# Patient Record
Sex: Female | Born: 1995 | Race: White | Hispanic: No | Marital: Single | Smoking: Current some day smoker
Health system: Southern US, Community
[De-identification: ages and names within clinical notes are randomized; demographics above are authoritative.]

---
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
## 2017-02-04 IMAGING — CR DG CHEST 2V
2 series · 2 of 2 positions shown · non-contrast
Comparison: None

CLINICAL DATA: 19-year-old female with mid chest pain and
congestion

EXAM:
CHEST  2 VIEW

[w chest pa]
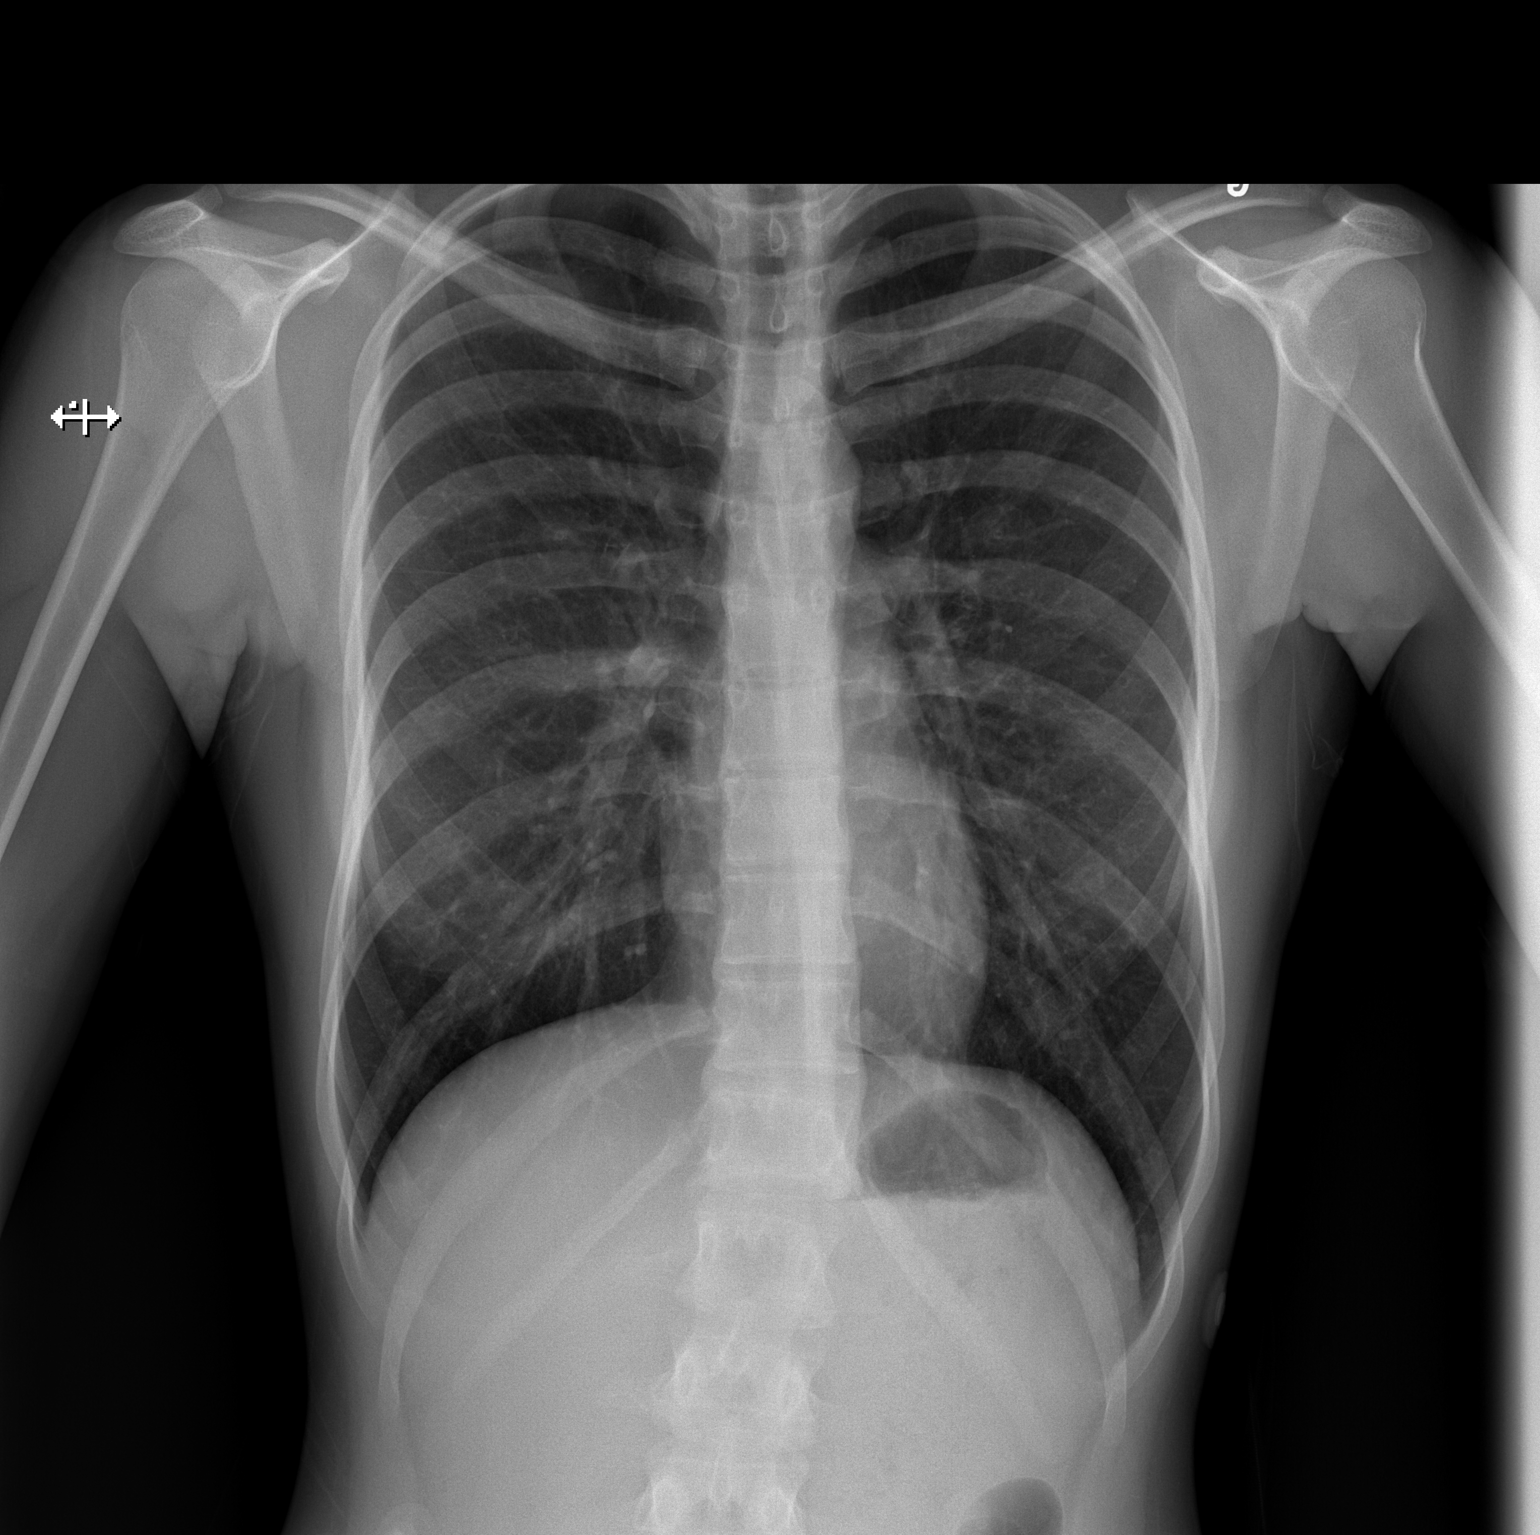

[w chest lat]
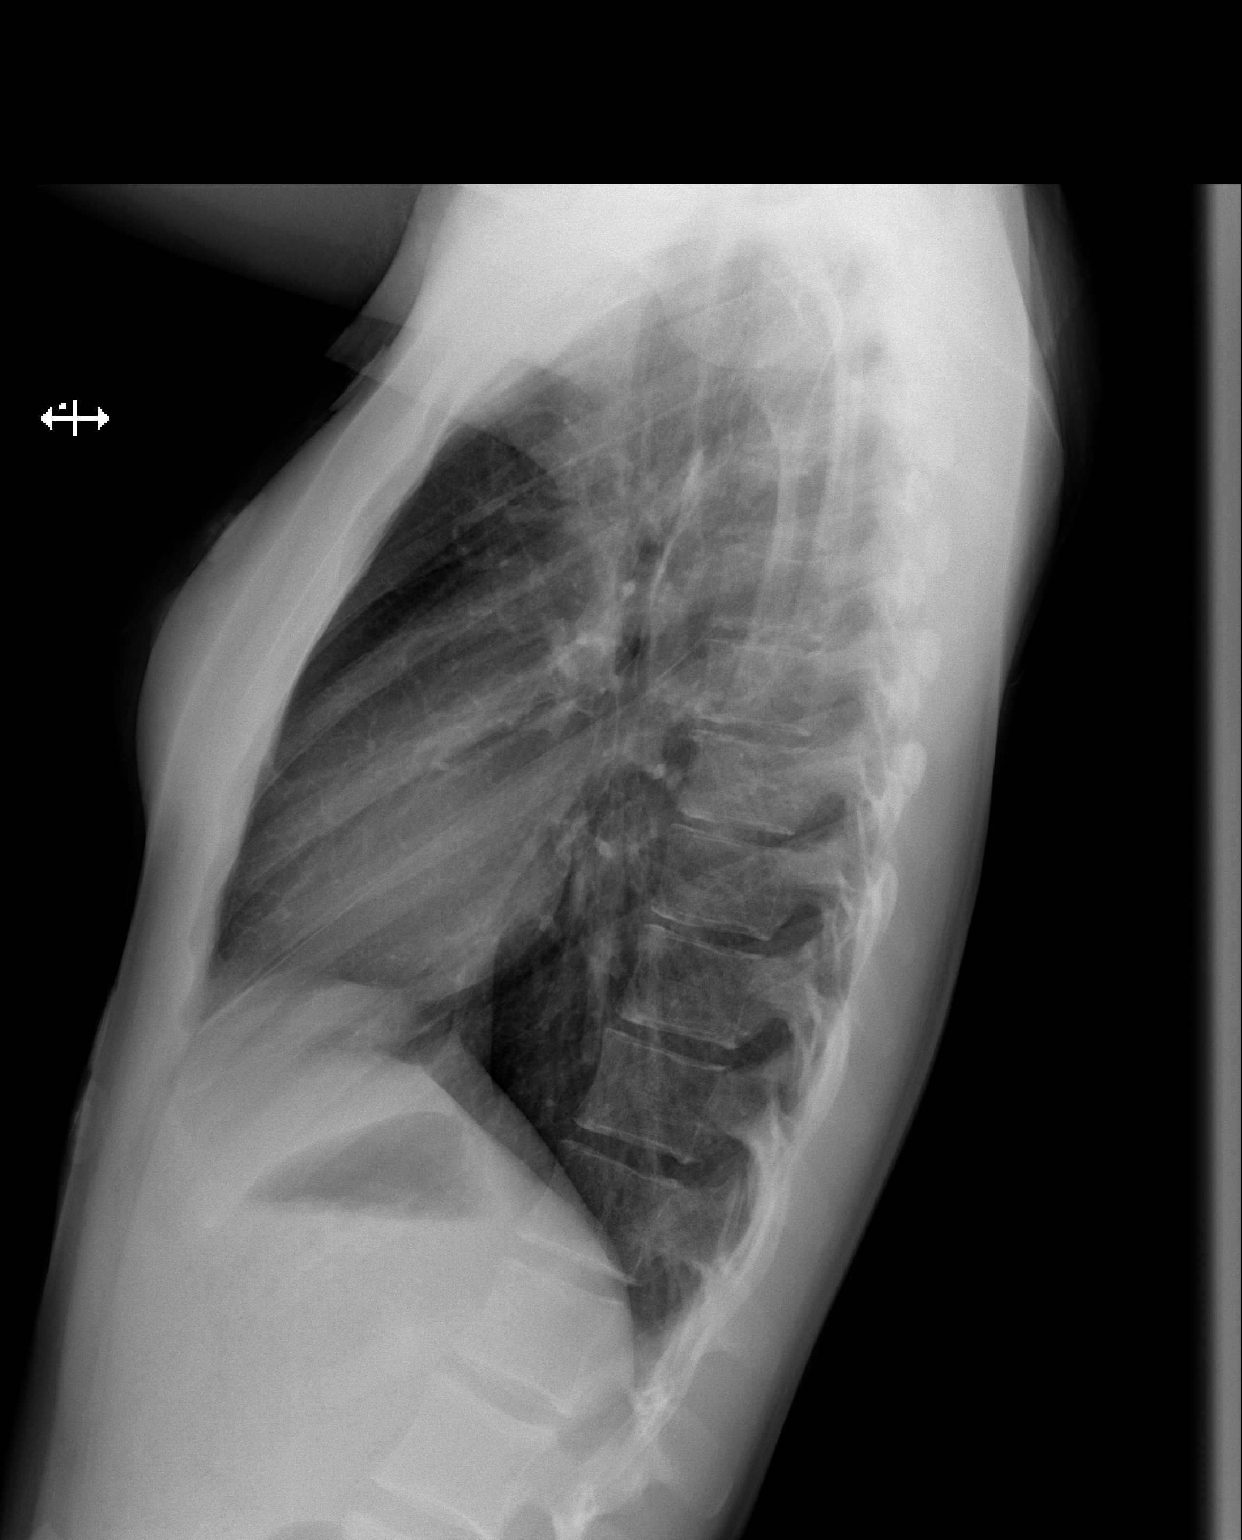

[2 of 2 positions shown; findings below may reference images not displayed]

FINDINGS: The lungs are clear and negative for focal airspace consolidation,
pulmonary edema or suspicious pulmonary nodule. No pleural effusion
or pneumothorax. Cardiac and mediastinal contours are within normal
limits. No acute fracture or lytic or blastic osseous lesions. The
visualized upper abdominal bowel gas pattern is unremarkable.
IMPRESSION: Normal chest x-ray.

## 2020-08-24 ENCOUNTER — Encounter (HOSPITAL_COMMUNITY): Payer: Self-pay | Admitting: Emergency Medicine

## 2020-08-24 ENCOUNTER — Emergency Department (HOSPITAL_COMMUNITY)
Admission: EM | Admit: 2020-08-24 | Discharge: 2020-08-24 | Disposition: A | Discharge status: Home / Self Care | Payer: Self-pay | Attending: Emergency Medicine | Admitting: Emergency Medicine

## 2020-08-24 ENCOUNTER — Emergency Department (HOSPITAL_COMMUNITY): Payer: Self-pay

## 2020-08-24 DIAGNOSIS — F17200 Nicotine dependence, unspecified, uncomplicated: Secondary | ICD-10-CM | POA: Insufficient documentation

## 2020-08-24 DIAGNOSIS — N83209 Unspecified ovarian cyst, unspecified side: Secondary | ICD-10-CM | POA: Insufficient documentation

## 2020-08-24 LAB — COMPREHENSIVE METABOLIC PANEL
ALT: 33 U/L (ref 0–44)
AST: 25 U/L (ref 15–41)
Albumin: 3.7 g/dL (ref 3.5–5.0)
Alkaline Phosphatase: 61 U/L (ref 38–126)
Anion gap: 6 (ref 5–15)
BUN: 9 mg/dL (ref 6–20)
CO2: 26 mmol/L (ref 22–32)
Calcium: 9.1 mg/dL (ref 8.9–10.3)
Chloride: 105 mmol/L (ref 98–111)
Creatinine, Ser: 0.69 mg/dL (ref 0.44–1.00)
GFR, Estimated: >60 mL/min (ref >60)
Glucose, Bld: 84 mg/dL (ref 70–99)
Potassium: 3.8 mmol/L (ref 3.5–5.1)
Sodium: 137 mmol/L (ref 135–145)
Total Bilirubin: 1.7 mg/dL — ABNORMAL HIGH (ref 0.3–1.2)
Total Protein: 7 g/dL (ref 6.5–8.1)

## 2020-08-24 LAB — CBC
HCT: 41.4 % (ref 36.0–46.0)
Hemoglobin: 13.2 g/dL (ref 12.0–15.0)
MCH: 28.6 pg (ref 26.0–34.0)
MCHC: 31.9 g/dL (ref 30.0–36.0)
MCV: 89.8 fL (ref 80.0–100.0)
Platelets: 339 10*3/uL (ref 150–400)
RBC: 4.61 MIL/uL (ref 3.87–5.11)
RDW: 13.6 % (ref 11.5–15.5)
WBC: 11.5 10*3/uL — ABNORMAL HIGH (ref 4.0–10.5)
nRBC: 0 % (ref 0.0–0.2)

## 2020-08-24 LAB — URINALYSIS, ROUTINE W REFLEX MICROSCOPIC
Bacteria, UA: NONE SEEN
Bilirubin Urine: NEGATIVE
Glucose, UA: NEGATIVE mg/dL
Hgb urine dipstick: NEGATIVE
Ketones, ur: NEGATIVE mg/dL
Nitrite: NEGATIVE
Protein, ur: NEGATIVE mg/dL
Specific Gravity, Urine: 1.015 (ref 1.005–1.030)
pH: 7 (ref 5.0–8.0)

## 2020-08-24 LAB — I-STAT BETA HCG BLOOD, ED (MC, WL, AP ONLY): I-stat hCG, quantitative: <5 m[IU]/mL (ref <5)

## 2020-08-24 LAB — LIPASE, BLOOD: Lipase: 31 U/L (ref 11–51)

## 2020-08-24 MED ORDER — IOHEXOL 300 MG/ML  SOLN
100.0000 mL | Freq: Once | INTRAMUSCULAR | Status: AC | PRN
  Administered 2020-08-24: 100 mL via INTRAVENOUS

## 2020-08-24 MED ORDER — MORPHINE SULFATE (PF) 4 MG/ML IV SOLN
4.0000 mg | Freq: Once | INTRAVENOUS | Status: AC
  Administered 2020-08-24: 4 mg via INTRAVENOUS
  Filled 2020-08-24: qty 1

## 2020-08-24 MED ORDER — NAPROXEN 500 MG PO TABS: 500.0000 mg | ORAL_TABLET | Freq: Two times a day (BID) | ORAL | 0 refills | Status: DC

## 2020-08-24 NOTE — ED Notes (Signed)
Pt ready for CT

## 2020-08-24 NOTE — ED Provider Notes (Signed)
[facility] EMERGENCY DEPARTMENT Provider Note   CSN: [id number] Arrival date & time: 08/24/20  1539     History No chief complaint on file.   Merryn Thaker is a 25 y.o. female.  25 year old female presents with acute abdominal pain worse in her lower quadrants since last night.  Has had some mild diarrhea but no emesis.  No fever or chills.  No vaginal bleeding or discharge peer denies any urinary symptoms.  Pain is characterizes sharp and worse with movement.  No treatment use prior to arrival.  No prior history of same        History reviewed. No pertinent past medical history.  There are no problems to display for this patient.   History reviewed. No pertinent surgical history.   OB History   No obstetric history on file.     No family history on file.  Social History   Tobacco Use  . Smoking status: Current Some Day Smoker  Substance Use Topics  . Alcohol use: No  . Drug use: No    Home Medications Prior to Admission medications   Not on File    Allergies    Elemental sulfur and Keflex [cephalexin]  Review of Systems   Review of Systems  All other systems reviewed and are negative.   Physical Exam Updated Vital Signs BP 133/78 (BP Location: Right Arm)   Pulse 96   Temp 98.3 F (36.8 C)   Resp 16   LMP 06/26/2020   SpO2 100%   Physical Exam Vitals and nursing note reviewed.  Constitutional:      General: She is not in acute distress.    Appearance: Normal appearance. She is well-developed. She is not toxic-appearing.  HENT:     Head: Normocephalic and atraumatic.  Eyes:     General: Lids are normal.     Conjunctiva/sclera: Conjunctivae normal.     Pupils: Pupils are equal, round, and reactive to light.  Neck:     Thyroid: No thyroid mass.     Trachea: No tracheal deviation.  Cardiovascular:     Rate and Rhythm: Normal rate and regular rhythm.     Heart sounds: Normal heart sounds. No murmur heard. No gallop.    Pulmonary:     Effort: Pulmonary effort is normal. No respiratory distress.     Breath sounds: Normal breath sounds. No stridor. No decreased breath sounds, wheezing, rhonchi or rales.  Abdominal:     General: Bowel sounds are normal. There is no distension.     Palpations: Abdomen is soft.     Tenderness: There is no abdominal tenderness. There is no rebound.    Musculoskeletal:        General: No tenderness. Normal range of motion.     Cervical back: Normal range of motion and neck supple.  Skin:    General: Skin is warm and dry.     Findings: No abrasion or rash.  Neurological:     Mental Status: She is alert and oriented to person, place, and time.     GCS: GCS eye subscore is 4. GCS verbal subscore is 5. GCS motor subscore is 6.     Cranial Nerves: No cranial nerve deficit.     Sensory: No sensory deficit.  Psychiatric:        Speech: Speech normal.        Behavior: Behavior normal.     ED Results / Procedures / Treatments   Labs (all labs ordered  are listed, but only abnormal results are displayed) Labs Reviewed  COMPREHENSIVE METABOLIC PANEL - Abnormal; Notable for the following components:      Result Value   Total Bilirubin 1.7 (*)    All other components within normal limits  CBC - Abnormal; Notable for the following components:   WBC 11.5 (*)    All other components within normal limits  URINALYSIS, ROUTINE W REFLEX MICROSCOPIC - Abnormal; Notable for the following components:   APPearance HAZY (*)    Leukocytes,Ua TRACE (*)    All other components within normal limits  LIPASE, BLOOD  I-STAT BETA HCG BLOOD, ED ([facility], [facility], [facility] ONLY)    EKG None  Radiology No results found.  Procedures Procedures   Medications Ordered in ED Medications  morphine 4 MG/ML injection 4 mg (has no administration in time range)    ED Course  I have reviewed the triage vital signs and the nursing notes.  Pertinent labs & imaging results that were available during my care  of the patient were reviewed by me and considered in my medical decision making (see chart for details).    MDM Rules/Calculators/A&P                         Pregnancy test negative.  CT abdomen pelvis consistent with ovarian cyst.  Patient's abdomen is nonsurgical and will discharge home Patient is urinalysis likely contaminated. Final Clinical Impression(s) / ED Diagnoses Final diagnoses:  None    Rx / DC Orders ED Discharge Orders    None       Lorre Nick, MD 08/24/20 2230

## 2020-08-24 NOTE — ED Triage Notes (Signed)
Pt reports lower abd pain since last night with nausea.  Denies vomiting and diarrhea.  LMP 06/26/20

## 2020-09-02 ENCOUNTER — Encounter (HOSPITAL_COMMUNITY): Payer: Self-pay

## 2020-09-02 ENCOUNTER — Other Ambulatory Visit: Payer: Self-pay

## 2020-09-02 ENCOUNTER — Emergency Department (HOSPITAL_COMMUNITY)
Admission: EM | Admit: 2020-09-02 | Discharge: 2020-09-02 | Disposition: A | Discharge status: Home / Self Care | Payer: Self-pay | Attending: Emergency Medicine | Admitting: Emergency Medicine

## 2020-09-02 DIAGNOSIS — R112 Nausea with vomiting, unspecified: Secondary | ICD-10-CM | POA: Insufficient documentation

## 2020-09-02 DIAGNOSIS — R110 Nausea: Secondary | ICD-10-CM

## 2020-09-02 DIAGNOSIS — Z20822 Contact with and (suspected) exposure to covid-19: Secondary | ICD-10-CM | POA: Insufficient documentation

## 2020-09-02 DIAGNOSIS — F17200 Nicotine dependence, unspecified, uncomplicated: Secondary | ICD-10-CM | POA: Insufficient documentation

## 2020-09-02 DIAGNOSIS — R1030 Lower abdominal pain, unspecified: Secondary | ICD-10-CM | POA: Insufficient documentation

## 2020-09-02 LAB — POC SARS CORONAVIRUS 2 AG -  ED: SARS Coronavirus 2 Ag: NEGATIVE

## 2020-09-02 NOTE — ED Provider Notes (Signed)
[facility] EMERGENCY DEPARTMENT Provider Note   CSN: [id number] Arrival date & time: 09/02/20  1242     History No chief complaint on file.   Lisa Bright is a 25 y.o. female.  HPI   This patient is a 25 year old female, she has no significant chronic prior medical history, she was seen in the emergency department on 20 March, at that time she had a cyst on her ovary causing some lower abdominal pain.  This has been intermittent since that time, she did have an episode of vomiting a couple of days ago and did not go to work last night because of that, she was told that she needed a Covid test before she could come back.  She does not have any other Covid symptoms including respiratory symptoms or gastrointestinal symptoms.  She has not been nauseated since that time no urinary symptoms, pregnancy test from 10 days ago was negative  History reviewed. No pertinent past medical history.  There are no problems to display for this patient.   History reviewed. No pertinent surgical history.   OB History   No obstetric history on file.     No family history on file.  Social History   Tobacco Use  . Smoking status: Current Some Day Smoker  Substance Use Topics  . Alcohol use: No  . Drug use: No    Home Medications Prior to Admission medications   Medication Sig Start Date End Date Taking? Authorizing Provider  acetaminophen (TYLENOL) 500 MG tablet Take 1,000 mg by mouth every 6 (six) hours as needed for moderate pain or headache.    [provider]  naproxen (NAPROSYN) 500 MG tablet Take 1 tablet (500 mg total) by mouth 2 (two) times daily. 08/24/20   Lorre Nick, MD    Allergies    Elemental sulfur, Keflex [cephalexin], and Sulfamethoxazole  Review of Systems   Review of Systems  Constitutional: Negative for fever.  Respiratory: Negative for shortness of breath.   Gastrointestinal: Negative for nausea and vomiting.  Genitourinary:  Negative for dysuria.  Musculoskeletal: Negative for back pain and myalgias.  Neurological: Negative for weakness and numbness.    Physical Exam Updated Vital Signs BP 123/86 (BP Location: Left Arm)   Pulse 88   Temp 99 F (37.2 C)   Resp 16   SpO2 99%   Physical Exam Vitals and nursing note reviewed.  Constitutional:      General: She is not in acute distress.    Appearance: She is well-developed.  HENT:     Head: Normocephalic and atraumatic.     Mouth/Throat:     Pharynx: No oropharyngeal exudate.  Eyes:     General: No scleral icterus.       Right eye: No discharge.        Left eye: No discharge.     Conjunctiva/sclera: Conjunctivae normal.     Pupils: Pupils are equal, round, and reactive to light.  Neck:     Thyroid: No thyromegaly.     Vascular: No JVD.  Cardiovascular:     Rate and Rhythm: Normal rate and regular rhythm.     Heart sounds: Normal heart sounds. No murmur heard. No friction rub. No gallop.   Pulmonary:     Effort: Pulmonary effort is normal. No respiratory distress.     Breath sounds: Normal breath sounds. No wheezing or rales.  Abdominal:     General: Bowel sounds are normal. There is no distension.  Palpations: Abdomen is soft. There is no mass.     Tenderness: There is no abdominal tenderness.     Comments: Abdominal exam is totally benign, no tenderness or masses or guarding  Musculoskeletal:        General: No tenderness. Normal range of motion.     Cervical back: Normal range of motion and neck supple.  Lymphadenopathy:     Cervical: No cervical adenopathy.  Skin:    General: Skin is warm and dry.     Findings: No erythema or rash.  Neurological:     Mental Status: She is alert.     Coordination: Coordination normal.  Psychiatric:        Behavior: Behavior normal.     ED Results / Procedures / Treatments   Labs (all labs ordered are listed, but only abnormal results are displayed) Labs Reviewed  POC SARS CORONAVIRUS 2 AG -   ED    EKG None  Radiology No results found.  Procedures Procedures   Medications Ordered in ED Medications - No data to display  ED Course  I have reviewed the triage vital signs and the nursing notes.  Pertinent labs & imaging results that were available during my care of the patient were reviewed by me and considered in my medical decision making (see chart for details).    MDM Rules/Calculators/A&P                          Well-appearing, normal vital signs, patient stable for discharge, negative Covid test, work note given.  Final Clinical Impression(s) / ED Diagnoses Final diagnoses:  Nauseated    Rx / DC Orders ED Discharge Orders    None       Eber Hong, MD 09/02/20 1723

## 2020-09-02 NOTE — ED Triage Notes (Signed)
Patient sent by her job for covid test because she vomited x 1 at work. States she had work-up last week here for abd pain and was told cyst

## 2020-09-02 NOTE — Discharge Instructions (Signed)
Your covid test is negative You may return to work

## 2021-02-14 ENCOUNTER — Other Ambulatory Visit: Payer: Self-pay

## 2021-02-14 ENCOUNTER — Emergency Department (HOSPITAL_BASED_OUTPATIENT_CLINIC_OR_DEPARTMENT_OTHER)
Admission: EM | Admit: 2021-02-14 | Discharge: 2021-02-14 | Disposition: A | Discharge status: Home / Self Care | Payer: Self-pay | Attending: Emergency Medicine | Admitting: Emergency Medicine

## 2021-02-14 ENCOUNTER — Encounter (HOSPITAL_BASED_OUTPATIENT_CLINIC_OR_DEPARTMENT_OTHER): Payer: Self-pay | Admitting: Emergency Medicine

## 2021-02-14 DIAGNOSIS — R110 Nausea: Secondary | ICD-10-CM | POA: Insufficient documentation

## 2021-02-14 DIAGNOSIS — F1193 Opioid use, unspecified with withdrawal: Secondary | ICD-10-CM

## 2021-02-14 DIAGNOSIS — E860 Dehydration: Secondary | ICD-10-CM

## 2021-02-14 DIAGNOSIS — E871 Hypo-osmolality and hyponatremia: Secondary | ICD-10-CM

## 2021-02-14 DIAGNOSIS — E876 Hypokalemia: Secondary | ICD-10-CM

## 2021-02-14 DIAGNOSIS — F1123 Opioid dependence with withdrawal: Secondary | ICD-10-CM

## 2021-02-14 DIAGNOSIS — R197 Diarrhea, unspecified: Secondary | ICD-10-CM | POA: Insufficient documentation

## 2021-02-14 DIAGNOSIS — F17200 Nicotine dependence, unspecified, uncomplicated: Secondary | ICD-10-CM | POA: Insufficient documentation

## 2021-02-14 LAB — COMPREHENSIVE METABOLIC PANEL
ALT: 13 U/L (ref 0–44)
AST: 17 U/L (ref 15–41)
Albumin: 3.8 g/dL (ref 3.5–5.0)
Alkaline Phosphatase: 68 U/L (ref 38–126)
Anion gap: 10 (ref 5–15)
BUN: 7 mg/dL (ref 6–20)
CO2: 27 mmol/L (ref 22–32)
Calcium: 9.3 mg/dL (ref 8.9–10.3)
Chloride: 92 mmol/L — ABNORMAL LOW (ref 98–111)
Creatinine, Ser: 0.67 mg/dL (ref 0.44–1.00)
GFR, Estimated: >60 mL/min (ref >60)
Glucose, Bld: 114 mg/dL — ABNORMAL HIGH (ref 70–99)
Potassium: 3.4 mmol/L — ABNORMAL LOW (ref 3.5–5.1)
Sodium: 129 mmol/L — ABNORMAL LOW (ref 135–145)
Total Bilirubin: 0.7 mg/dL (ref 0.3–1.2)
Total Protein: 7.3 g/dL (ref 6.5–8.1)

## 2021-02-14 LAB — PREGNANCY, URINE: Preg Test, Ur: NEGATIVE

## 2021-02-14 LAB — CBC
HCT: 46.3 % — ABNORMAL HIGH (ref 36.0–46.0)
Hemoglobin: 16 g/dL — ABNORMAL HIGH (ref 12.0–15.0)
MCH: 29.3 pg (ref 26.0–34.0)
MCHC: 34.6 g/dL (ref 30.0–36.0)
MCV: 84.6 fL (ref 80.0–100.0)
Platelets: 312 10*3/uL (ref 150–400)
RBC: 5.47 MIL/uL — ABNORMAL HIGH (ref 3.87–5.11)
RDW: 12.3 % (ref 11.5–15.5)
WBC: 10.5 10*3/uL (ref 4.0–10.5)
nRBC: 0 % (ref 0.0–0.2)

## 2021-02-14 MED ORDER — ONDANSETRON HCL 4 MG/2ML IJ SOLN
4.0000 mg | Freq: Once | INTRAMUSCULAR | Status: AC
  Administered 2021-02-14: 4 mg via INTRAVENOUS
  Filled 2021-02-14: qty 2

## 2021-02-14 MED ORDER — HYDROXYZINE HCL 25 MG PO TABS
25.0000 mg | ORAL_TABLET | Freq: Once | ORAL | Status: AC
  Administered 2021-02-14: 25 mg via ORAL
  Filled 2021-02-14: qty 1

## 2021-02-14 MED ORDER — ACETAMINOPHEN 500 MG PO TABS
1000.0000 mg | ORAL_TABLET | Freq: Once | ORAL | Status: AC
  Administered 2021-02-14: 1000 mg via ORAL
  Filled 2021-02-14: qty 2

## 2021-02-14 MED ORDER — ONDANSETRON 8 MG PO TBDP: 8.0000 mg | ORAL_TABLET | Freq: Three times a day (TID) | ORAL | 0 refills | Status: DC | PRN

## 2021-02-14 MED ORDER — POTASSIUM CHLORIDE CRYS ER 20 MEQ PO TBCR
40.0000 meq | EXTENDED_RELEASE_TABLET | Freq: Once | ORAL | Status: AC
  Administered 2021-02-14: 40 meq via ORAL
  Filled 2021-02-14: qty 2

## 2021-02-14 MED ORDER — DICYCLOMINE HCL 10 MG PO CAPS
10.0000 mg | ORAL_CAPSULE | Freq: Once | ORAL | Status: AC
  Administered 2021-02-14: 10 mg via ORAL
  Filled 2021-02-14: qty 1

## 2021-02-14 MED ORDER — SODIUM CHLORIDE 0.9 % IV BOLUS
1000.0000 mL | Freq: Once | INTRAVENOUS | Status: AC
  Administered 2021-02-14: 1000 mL via INTRAVENOUS

## 2021-02-14 NOTE — ED Notes (Signed)
Aunt "Marylene [phone].

## 2021-02-14 NOTE — ED Notes (Signed)
Patient given ginger ale. 

## 2021-02-14 NOTE — ED Notes (Signed)
Pt states that she feels much better.

## 2021-02-14 NOTE — Discharge Instructions (Addendum)
It was our pleasure to provide your ER care today - we hope that you feel better.  Drink plenty of fluids/stay well hydrated. From today's labs, your potassium level is mildly low - eat plenty of fruits and vegetables, and follow up with primary care doctor.   Take zofran as need for nausea. Take acetaminophen or ibuprofen as need.   See resource guide provided for helping you access substance use treatment programs.  Follow up with primary care doctor in the next 1-2 weeks.   Return to ER if worse, new symptoms, fevers, new/severe pain, persistent vomiting, or other concern.

## 2021-02-14 NOTE — ED Triage Notes (Signed)
Pt from home c/o generalized weakness and diarrhea. Two days ago Pt received Narcan from a fiend 4 different times starting around noon. Pt states that she wants to quit taking heroin and is in withdrawals.

## 2021-02-14 NOTE — ED Provider Notes (Signed)
[facility] EMERGENCY DEPT Provider Note   CSN: [id number] Arrival date & time: 02/14/21  1054     History Chief Complaint  Patient presents with   Diarrhea   Withdrawal    Lisa Bright is a 25 y.o. female.  Patient with hx heroin use disorder (snorts, daily) presents indicating generally has not felt well since accidental overdose two days ago when she was given narcan. Has not used since. States in past two days generally feeling ill, nausea, and diarrheal stool. No bloody bms. No bloody emesis. No abd pain or distension. No fever or chills. No chest pain or discomfort. No sob. No headache. Denies other substance use problems. No etoh abuse problems.   The history is provided by the patient.  Diarrhea Associated symptoms: no abdominal pain, no chills, no fever and no headaches       No past medical history on file.  There are no problems to display for this patient.   History reviewed. No pertinent surgical history.   OB History   No obstetric history on file.     No family history on file.  Social History   Tobacco Use   Smoking status: Some Days  Substance Use Topics   Alcohol use: No   Drug use: Yes    Frequency: 14.0 times per week    Comment: Snorts Herion    Home Medications Prior to Admission medications   Medication Sig Start Date End Date Taking? Authorizing Provider  acetaminophen (TYLENOL) 500 MG tablet Take 1,000 mg by mouth every 6 (six) hours as needed for moderate pain or headache.   Yes [provider]  naproxen (NAPROSYN) 500 MG tablet Take 1 tablet (500 mg total) by mouth 2 (two) times daily. 08/24/20  Yes Lorre Nick, MD    Allergies    Elemental sulfur, Keflex [cephalexin], and Sulfamethoxazole  Review of Systems   Review of Systems  Constitutional:  Negative for chills and fever.  HENT:  Negative for sore throat.   Eyes:  Negative for visual disturbance.  Respiratory:  Negative for cough and shortness of  breath.   Cardiovascular:  Negative for chest pain.  Gastrointestinal:  Positive for diarrhea and nausea. Negative for abdominal pain.  Genitourinary:  Negative for flank pain.  Musculoskeletal:  Negative for back pain and neck pain.  Skin:  Negative for rash.  Neurological:  Negative for headaches.  Hematological:  Does not bruise/bleed easily.  Psychiatric/Behavioral:  Negative for confusion.    Physical Exam Updated Vital Signs Ht 1.6 m (5\' 3" )   Wt 54.4 kg   LMP 02/05/2021 (Approximate)   BMI 21.26 kg/m   Physical Exam Vitals and nursing note reviewed.  Constitutional:      Appearance: Normal appearance. She is well-developed.  HENT:     Head: Atraumatic.     Nose: Nose normal.     Mouth/Throat:     Mouth: Mucous membranes are moist.     Pharynx: Oropharynx is clear. No oropharyngeal exudate or posterior oropharyngeal erythema.  Eyes:     General: No scleral icterus.    Conjunctiva/sclera: Conjunctivae normal.     Pupils: Pupils are equal, round, and reactive to light.  Neck:     Trachea: No tracheal deviation.  Cardiovascular:     Rate and Rhythm: Normal rate and regular rhythm.     Pulses: Normal pulses.     Heart sounds: Normal heart sounds. No murmur heard.   No friction rub. No gallop.  Pulmonary:  Effort: Pulmonary effort is normal. No respiratory distress.     Breath sounds: Normal breath sounds.  Abdominal:     General: Bowel sounds are normal. There is no distension.     Palpations: Abdomen is soft.     Tenderness: There is no abdominal tenderness. There is no guarding.  Genitourinary:    Comments: No cva tenderness.  Musculoskeletal:        General: No swelling or tenderness.     Cervical back: Normal range of motion and neck supple. No rigidity. No muscular tenderness.     Comments: CTLS spine, non tender, aligned, no step off.   Skin:    General: Skin is warm and dry.     Findings: No rash.  Neurological:     Mental Status: She is alert.      Comments: Alert, speech normal. Steady gait.   Psychiatric:        Mood and Affect: Mood normal.    ED Results / Procedures / Treatments   Labs (all labs ordered are listed, but only abnormal results are displayed) Results for orders placed or performed during the hospital encounter of 02/14/21  CBC  Result Value Ref Range   WBC 10.5 4.0 - 10.5 K/uL   RBC 5.47 (H) 3.87 - 5.11 MIL/uL   Hemoglobin 16.0 (H) 12.0 - 15.0 g/dL   HCT 34.1 (H) 93.7 - 90.2 %   MCV 84.6 80.0 - 100.0 fL   MCH 29.3 26.0 - 34.0 pg   MCHC 34.6 30.0 - 36.0 g/dL   RDW 40.9 73.5 - 32.9 %   Platelets 312 150 - 400 K/uL   nRBC 0.0 0.0 - 0.2 %  Comprehensive metabolic panel  Result Value Ref Range   Sodium 129 (L) 135 - 145 mmol/L   Potassium 3.4 (L) 3.5 - 5.1 mmol/L   Chloride 92 (L) 98 - 111 mmol/L   CO2 27 22 - 32 mmol/L   Glucose, Bld 114 (H) 70 - 99 mg/dL   BUN 7 6 - 20 mg/dL   Creatinine, Ser 9.24 0.44 - 1.00 mg/dL   Calcium 9.3 8.9 - 26.8 mg/dL   Total Protein 7.3 6.5 - 8.1 g/dL   Albumin 3.8 3.5 - 5.0 g/dL   AST 17 15 - 41 U/L   ALT 13 0 - 44 U/L   Alkaline Phosphatase 68 38 - 126 U/L   Total Bilirubin 0.7 0.3 - 1.2 mg/dL   GFR, Estimated >34 >19 mL/min   Anion gap 10 5 - 15  Pregnancy, urine  Result Value Ref Range   Preg Test, Ur NEGATIVE NEGATIVE   No results found.  EKG None  Radiology No results found.  Procedures Procedures   Medications Ordered in ED Medications  sodium chloride 0.9 % bolus 1,000 mL (has no administration in time range)  ondansetron (ZOFRAN) injection 4 mg (has no administration in time range)    ED Course  I have reviewed the triage vital signs and the nursing notes.  Pertinent labs & imaging results that were available during my care of the patient were reviewed by me and considered in my medical decision making (see chart for details).    MDM Rules/Calculators/A&P                          Iv ns bolus. Zofran Iv. Labs sent.   Reviewed nursing notes  and prior charts for additional history.   Labs reviewed/interpreted by me -  na and k mildly low. Kcl po. Ns bolus.   Po fluids/food.   No recurrent nausea/vomiting.   Will provide resource guide for pursuing outpt rehab/SA tx.   Return precautions provided.      Final Clinical Impression(s) / ED Diagnoses Final diagnoses:  None    Rx / DC Orders ED Discharge Orders     None        Cathren Laine, MD 02/14/21 1301

## 2022-02-11 ENCOUNTER — Ambulatory Visit (HOSPITAL_COMMUNITY)
Admission: EM | Admit: 2022-02-11 | Discharge: 2022-02-12 | Disposition: A | Discharge status: Home / Self Care | Payer: No Payment, Other | Attending: Nurse Practitioner | Admitting: Nurse Practitioner

## 2022-02-11 ENCOUNTER — Other Ambulatory Visit: Payer: Self-pay

## 2022-02-11 DIAGNOSIS — F1111 Opioid abuse, in remission: Secondary | ICD-10-CM

## 2022-02-11 DIAGNOSIS — Z20822 Contact with and (suspected) exposure to covid-19: Secondary | ICD-10-CM | POA: Insufficient documentation

## 2022-02-11 DIAGNOSIS — F1120 Opioid dependence, uncomplicated: Secondary | ICD-10-CM | POA: Insufficient documentation

## 2022-02-11 DIAGNOSIS — F1910 Other psychoactive substance abuse, uncomplicated: Secondary | ICD-10-CM | POA: Diagnosis not present

## 2022-02-11 DIAGNOSIS — F1510 Other stimulant abuse, uncomplicated: Secondary | ICD-10-CM | POA: Insufficient documentation

## 2022-02-11 LAB — COMPREHENSIVE METABOLIC PANEL
ALT: 15 U/L (ref 0–44)
AST: 17 U/L (ref 15–41)
Albumin: 4.1 g/dL (ref 3.5–5.0)
Alkaline Phosphatase: 61 U/L (ref 38–126)
Anion gap: 8 (ref 5–15)
BUN: 11 mg/dL (ref 6–20)
CO2: 29 mmol/L (ref 22–32)
Calcium: 10 mg/dL (ref 8.9–10.3)
Chloride: 102 mmol/L (ref 98–111)
Creatinine, Ser: 0.59 mg/dL (ref 0.44–1.00)
GFR, Estimated: >60 mL/min (ref >60)
Glucose, Bld: 122 mg/dL — ABNORMAL HIGH (ref 70–99)
Potassium: 3.7 mmol/L (ref 3.5–5.1)
Sodium: 139 mmol/L (ref 135–145)
Total Bilirubin: 1.4 mg/dL — ABNORMAL HIGH (ref 0.3–1.2)
Total Protein: 7.3 g/dL (ref 6.5–8.1)

## 2022-02-11 LAB — CBC WITH DIFFERENTIAL/PLATELET
Abs Immature Granulocytes: 0.03 10*3/uL (ref 0.00–0.07)
Basophils Absolute: 0.1 10*3/uL (ref 0.0–0.1)
Basophils Relative: 1 %
Eosinophils Absolute: 0 10*3/uL (ref 0.0–0.5)
Eosinophils Relative: 0 %
HCT: 41.5 % (ref 36.0–46.0)
Hemoglobin: 14.1 g/dL (ref 12.0–15.0)
Immature Granulocytes: 0 %
Lymphocytes Relative: 19 %
Lymphs Abs: 2 10*3/uL (ref 0.7–4.0)
MCH: 29.6 pg (ref 26.0–34.0)
MCHC: 34 g/dL (ref 30.0–36.0)
MCV: 87.2 fL (ref 80.0–100.0)
Monocytes Absolute: 0.6 10*3/uL (ref 0.1–1.0)
Monocytes Relative: 5 %
Neutro Abs: 7.9 10*3/uL — ABNORMAL HIGH (ref 1.7–7.7)
Neutrophils Relative %: 75 %
Platelets: 345 10*3/uL (ref 150–400)
RBC: 4.76 MIL/uL (ref 3.87–5.11)
RDW: 12.7 % (ref 11.5–15.5)
WBC: 10.6 10*3/uL — ABNORMAL HIGH (ref 4.0–10.5)
nRBC: 0 % (ref 0.0–0.2)

## 2022-02-11 LAB — POCT URINE DRUG SCREEN - MANUAL ENTRY (I-SCREEN)
POC Amphetamine UR: POSITIVE — AB
POC Buprenorphine (BUP): NOT DETECTED
POC Cocaine UR: NOT DETECTED
POC Marijuana UR: POSITIVE — AB
POC Methadone UR: NOT DETECTED
POC Methamphetamine UR: POSITIVE — AB
POC Morphine: NOT DETECTED
POC Oxazepam (BZO): NOT DETECTED
POC Oxycodone UR: NOT DETECTED
POC Secobarbital (BAR): NOT DETECTED

## 2022-02-11 LAB — LIPID PANEL
Cholesterol: 165 mg/dL (ref 0–200)
HDL: 69 mg/dL (ref >40)
LDL Cholesterol: 86 mg/dL (ref 0–99)
Total CHOL/HDL Ratio: 2.4 RATIO
Triglycerides: 48 mg/dL (ref <150)
VLDL: 10 mg/dL (ref 0–40)

## 2022-02-11 LAB — RESP PANEL BY RT-PCR (FLU A&B, COVID) ARPGX2
Influenza A by PCR: NEGATIVE
Influenza B by PCR: NEGATIVE
SARS Coronavirus 2 by RT PCR: NEGATIVE

## 2022-02-11 LAB — HEMOGLOBIN A1C
Hgb A1c MFr Bld: 5.2 % (ref 4.8–5.6)
Mean Plasma Glucose: 102.54 mg/dL

## 2022-02-11 LAB — TSH: TSH: 0.839 u[IU]/mL (ref 0.350–4.500)

## 2022-02-11 MED ORDER — ACETAMINOPHEN 325 MG PO TABS: 650.0000 mg | ORAL_TABLET | Freq: Four times a day (QID) | ORAL | Status: DC | PRN

## 2022-02-11 MED ORDER — ALUM & MAG HYDROXIDE-SIMETH 200-200-20 MG/5ML PO SUSP: 30.0000 mL | ORAL | Status: DC | PRN

## 2022-02-11 MED ORDER — CLONIDINE HCL 0.1 MG PO TABS
0.1000 mg | ORAL_TABLET | Freq: Four times a day (QID) | ORAL | Status: DC
  Administered 2022-02-11: 0.1 mg via ORAL
  Filled 2022-02-11 (×2): qty 1

## 2022-02-11 MED ORDER — ONDANSETRON 4 MG PO TBDP: 4.0000 mg | ORAL_TABLET | Freq: Four times a day (QID) | ORAL | Status: DC | PRN

## 2022-02-11 MED ORDER — NICOTINE 14 MG/24HR TD PT24
14.0000 mg | MEDICATED_PATCH | Freq: Every day | TRANSDERMAL | Status: DC
  Administered 2022-02-12: 14 mg via TRANSDERMAL
  Filled 2022-02-11: qty 1

## 2022-02-11 MED ORDER — METHOCARBAMOL 500 MG PO TABS: 500.0000 mg | ORAL_TABLET | Freq: Three times a day (TID) | ORAL | Status: DC | PRN

## 2022-02-11 MED ORDER — MAGNESIUM HYDROXIDE 400 MG/5ML PO SUSP: 30.0000 mL | Freq: Every day | ORAL | Status: DC | PRN

## 2022-02-11 MED ORDER — LOPERAMIDE HCL 2 MG PO CAPS: 2.0000 mg | ORAL_CAPSULE | ORAL | Status: DC | PRN

## 2022-02-11 MED ORDER — CLONIDINE HCL 0.1 MG PO TABS: 0.1000 mg | ORAL_TABLET | ORAL | Status: DC

## 2022-02-11 MED ORDER — NAPROXEN 500 MG PO TABS: 500.0000 mg | ORAL_TABLET | Freq: Two times a day (BID) | ORAL | Status: DC | PRN

## 2022-02-11 MED ORDER — HYDROXYZINE HCL 25 MG PO TABS
25.0000 mg | ORAL_TABLET | Freq: Four times a day (QID) | ORAL | Status: DC | PRN
  Administered 2022-02-11: 25 mg via ORAL
  Filled 2022-02-11: qty 1

## 2022-02-11 MED ORDER — CLONIDINE HCL 0.1 MG PO TABS: 0.1000 mg | ORAL_TABLET | Freq: Every day | ORAL | Status: DC

## 2022-02-11 MED ORDER — DICYCLOMINE HCL 20 MG PO TABS: 20.0000 mg | ORAL_TABLET | Freq: Four times a day (QID) | ORAL | Status: DC | PRN

## 2022-02-11 NOTE — Progress Notes (Signed)
Patient presents to the [facility] seeking detox for her addiction issues. 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  Patient states that she is using 1 gram daily.  Her last use was yesterday.  Current withdrawal symptoms include: nauseous, shakes, chills, sweats, vomiting and boday aches. Patient states that she has never been in treatment of detox in the past. Patient states that she has also been abusing methamphetamine daily, in the past, but states that she is now only using 1-2 times weekly.  She states that she uses 1 gram at a time and her last use was two days ago.  Patient denies SI/HI/Psychosis.  Patient states that her sleep and appetite varies depending on the drug she is using.  Patient is requesting detox.  Patient is urgent.
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
## 2022-02-11 NOTE — ED Notes (Signed)
Pregnancy negative.  

## 2022-02-11 NOTE — ED Notes (Signed)
Pt A&O x 4, presents requesting detox from Meth & Fentanyl use.  Pt very nervous,fidgety but cooperative.  Pt wants to move back home with mother and daughter.  Denies SI, HI or AVH.  Monitoring for safety.

## 2022-02-11 NOTE — ED Notes (Signed)
Pt sleeping at present, no distress noted.,  Monitoring for safety.,
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
## 2022-02-12 ENCOUNTER — Encounter (HOSPITAL_COMMUNITY): Payer: Self-pay | Admitting: Emergency Medicine

## 2022-02-12 LAB — HEPATITIS PANEL, ACUTE
HCV Ab: NONREACTIVE
Hep A IgM: NONREACTIVE
Hep B C IgM: NONREACTIVE
Hepatitis B Surface Ag: NONREACTIVE

## 2022-02-12 NOTE — Discharge Instructions (Addendum)
Good afternoon, Lisa Bright!  As discussed, a referral for CD-IOP was made to Maeola Sarah, [facility], with [facility] on the 2nd floor of the same building you were just discharged from.  She will be reaching out to you once she reviews your chart to schedule a day and time for an intake and assessment.  This is an in-person treatment setting.  Below are a list of other resources for substance use treatment.  Something to keep in mind is that there is a Insurance claims handler center in [place] with [employer].  That address and contact information will be provided below.   [facility] 2nd floor of the building [address], [place], [address] [phone] phone   SUBSTANCE USE TREATMENT for Medicaid and State Funded/IPRS  [facility] [address], [place], [address] [phone] phone NOTE: [facility] is no longer offering IOP services.  Serves those who are low-income or have no insurance.  [facility] [address], [place], [place], [address] [phone] phone [phone] fax NOTE: Does have Substance Abuse-Intensive Outpatient Program [facility]) as well as transitional housing if eligible.  [facility] [address] [place], [place], [address] [phone] phone [phone] fax  [facility] Recovery Services - 28-day Residential Treatment program [address] [place], [place], [address] [phone] phone [phone] fax  [facility] - [facility] and [facility] in [facility] [address] [place]. [place], [place], [address] [phone] phone [phone] fax   CHARITABLE RESIDENTIAL REHABS  [facility] (women only at this campus) [address] [address] [place], [place] [address] [phone] ((These programs listed above have a one-time application fee.))   [address] [place]  [address] [phone]  [facility] [phone] [address] [place], [place] [address] [phone], [place] [address] [phone]    MEDICAL DETOX/RESIDENTIAL TREATMENT- MEDICAID/IPRS:  [facility] [address] [place], [place] [address] [phone]  [facility] Services [address] [place], [place] [address] [phone]  [facility] Recovery Services [address] [place]. [place], [place] [address] [phone]  [facility] Recovery Services [address] [place], [place] [address] [phone]  [facility] Recovery Services [address] [place], [place] [address] [phone]  ((For admissions to these three [facility] facilities during weekday days and possibly other times, contact [place], phone: [phone]; fax: [phone]))  [facility] Services (detox for men and women) [address] [place], [place] [address] [phone]   OUTPATIENT PROGRAMS:  [facility]) [address] [place], [place] [address] [phone]  ((CD-IOP is currently not operational; Opioid replacement clinic is operational))   [facility] (Medicaid & IPRS for [facility] residents) [address] [place], [place] [address] [phone]  ((CD-IOP; new clients must go through walk-in clinic))   [facility] and safety inspector (Medicaid & IPRS for [facility] residents) [address] [place], [place] [address] [phone]  ((CD-IOP))   [facility] (Medicaid for [employer] and [employer]; some private insurance) [address] [place], [place] [address] [phone]  ((CD-IOP))   [facility] & private insurance) [phone] [place], [place] [address] [phone]  ((CD-IOP (morning & evening programs); individual therapy))   HALFWAY HOUSES:  [facility] [employer] [phone]  [email or web address].[email or web address]   OPIOID  REPLACEMENT PROVIDERS:  [facility]) [address], [place] [address] [phone]  [facility] [address] [place], [place] [address] [phone]  [facility] [address] [place], [place] [address] [phone]   12 STEP PROGRAMS:  Alcoholics Anonymous of [place] [employer]  Narcotics Anonymous of [place] [employer]ct.dk  Al-Anon of [place], [place] www.[place]alanon.org/find-meetings.html  Nar-Anon https://nar-anon.org/find-[email or web address]-meetin

## 2022-02-12 NOTE — ED Notes (Signed)
Pt resting with eyes closed.  Breathing is even and unlabored.  Denies SI, HI, AVH and pain.  Will continue to monitor for safety. 

## 2022-02-12 NOTE — ED Provider Notes (Signed)
[facility]BC/OBS ASAP Discharge Summary  Date and Time: 02/12/2022 2:06 PM  Name: Lisa Bright  MRN:  [id number]   Discharge Diagnoses:  Final diagnoses:  Fentanyl use disorder, severe, dependence (HCC)  Methamphetamine abuse (HCC)  History of heroin abuse (HCC)    Subjective: Lisa Bright is seeking detox and outpatient resources for substance abuse issues.  NP spoke to social worker regarding additional outpatient resources.  Patient was offered a bed at [facility] for residential treatment facility.  She declined due to " I cannot be away from my kids for a full year."  CSW made additional outpatient resources available for [facility] and [facility]  And/or consider following up with residential treatment services [facility]   During evaluation Lisa Bright is sitting in no acute distress. She is alert/oriented x 4; calm/cooperative; and mood congruent with affect. She is speaking in a clear tone at moderate volume, and normal pace; with good eye contact.  Her thought process is coherent and relevant; There is no indication that she is currently responding to internal/external stimuli or experiencing delusional thought content; and she has denied suicidal/self-harm/homicidal ideation, psychosis, and paranoia.   Patient has remained calm throughout assessment and has answered questions appropriately.    At this time Lisa Bright is educated and verbalizes understanding of mental health resources and other crisis services in the community. She is instructed to call 911 and present to the nearest emergency room should she experience any suicidal/homicidal ideation, auditory/visual/hallucinations, or detrimental worsening of her mental health condition. She was a also advised by Clinical research associate that she could call the toll-free phone on insurance card to assist with identifying in network counselors and agencies or number on back of Medicaid card to speak with care coordinator    Total Time spent with patient: 15  minutes  Past Psychiatric History:  Past Medical History: History reviewed. No pertinent past medical history. History reviewed. No pertinent surgical history. Family History: History reviewed. No pertinent family history. Family Psychiatric History:  Social History:  Social History   Substance and Sexual Activity  Alcohol Use No     Social History   Substance and Sexual Activity  Drug Use Yes   Frequency: 14.0 times per week   Comment: Snorts Herion    Social History   Socioeconomic History   Marital status: Single    Spouse name: Not on file   Number of children: Not on file   Years of education: Not on file   Highest education level: Not on file  Occupational History   Not on file  Tobacco Use   Smoking status: Some Days   Smokeless tobacco: Not on file  Substance and Sexual Activity   Alcohol use: No   Drug use: Yes    Frequency: 14.0 times per week    Comment: Snorts Herion   Sexual activity: Not on file  Other Topics Concern   Not on file  Social History Narrative   Not on file   Social Determinants of Health   Financial Resource Strain: Not on file  Food Insecurity: Not on file  Transportation Needs: Not on file  Physical Activity: Not on file  Stress: Not on file  Social Connections: Not on file   SDOH:  SDOH Screenings   Tobacco Use: High Risk (02/12/2022)    Tobacco Cessation:  N/A, patient does not currently use tobacco products  Current Medications:  Current Facility-Administered Medications  Medication Dose Route Frequency Provider Last Rate Last Admin   alum & mag  hydroxide-simeth (MAALOX/MYLANTA) 200-200-20 MG/5ML suspension 30 mL  30 mL Oral Q4H PRN Onuoha, Chinwendu V, NP       cloNIDine (CATAPRES) tablet 0.1 mg  0.1 mg Oral QID Onuoha, Chinwendu V, NP   0.1 mg at 02/11/22 2204   Followed by   Melene Muller ON 02/14/2022] cloNIDine (CATAPRES) tablet 0.1 mg  0.1 mg Oral BH-qamhs Onuoha, Chinwendu V, NP       Followed by   Melene Muller ON 02/17/2022]  cloNIDine (CATAPRES) tablet 0.1 mg  0.1 mg Oral QAC breakfast Onuoha, Chinwendu V, NP       dicyclomine (BENTYL) tablet 20 mg  20 mg Oral Q6H PRN Onuoha, Chinwendu V, NP       hydrOXYzine (ATARAX) tablet 25 mg  25 mg Oral Q6H PRN Onuoha, Chinwendu V, NP   25 mg at 02/11/22 2153   loperamide (IMODIUM) capsule 2-4 mg  2-4 mg Oral PRN Onuoha, Chinwendu V, NP       magnesium hydroxide (MILK OF MAGNESIA) suspension 30 mL  30 mL Oral Daily PRN Onuoha, Chinwendu V, NP       methocarbamol (ROBAXIN) tablet 500 mg  500 mg Oral Q8H PRN Onuoha, Chinwendu V, NP       naproxen (NAPROSYN) tablet 500 mg  500 mg Oral BID PRN Onuoha, Chinwendu V, NP       nicotine (NICODERM CQ - dosed in mg/24 hours) patch 14 mg  14 mg Transdermal Q0600 Onuoha, Chinwendu V, NP   14 mg at 02/12/22 0936   ondansetron (ZOFRAN-ODT) disintegrating tablet 4 mg  4 mg Oral Q6H PRN Onuoha, Chinwendu V, NP       No current outpatient medications on file.    PTA Medications: (Not in a hospital admission)       No data to display          Flowsheet Row ED from 02/11/2022 in [facility] ED from 02/14/2021 in [facility] ED from 09/02/2020 in [facility] EMERGENCY DEPART[facility]  C-SSRS RISK CATEGORY No Risk No Risk No Risk       Musculoskeletal  Strength & Muscle Tone: within normal limits Gait & Station: normal Patient leans: N/A  Psychiatric Specialty Exam  Presentation  General Appearance: Appropriate for Environment  Eye Contact:Good  Speech:Clear and Coherent  Speech Volume:Normal  Handedness:Right   Mood and Affect  Mood:Anxious; Depressed  Affect:Congruent   Thought Process  Thought Processes:Coherent  Descriptions of Associations:Intact  Orientation:Full (Time, Place and Person)  Thought Content:Logical     Hallucinations:Hallucinations: None  Ideas of Reference:None  Suicidal Thoughts:Suicidal Thoughts: No  Homicidal  Thoughts:Homicidal Thoughts: No   Sensorium  Memory:Immediate Good; Recent Good; Remote Good  Judgment:Good  Insight:Fair   Executive Functions  Concentration:Good  Attention Span:Good  Recall:Good  Fund of Knowledge:Good  Language:Good   Psychomotor Activity  Psychomotor Activity:Psychomotor Activity: Normal   Assets  Assets:Communication Skills; Desire for Improvement; Social Support   Sleep  Sleep:Sleep: Good   Nutritional Assessment (For OBS and FBC admissions only) Has the patient had a weight loss or gain of 10 pounds or more in the last 3 months?: No Has the patient had a decrease in food intake/or appetite?: No Does the patient have dental problems?: No Does the patient have eating habits or behaviors that may be indicators of an eating disorder including binging or inducing vomiting?: No Has the patient recently lost weight without trying?: 0 Has the patient been eating poorly because of a  decreased appetite?: 0 Malnutrition Screening Tool Score: 0    Physical Exam  Physical Exam ROS Blood pressure (!) 99/56, pulse (!) 110, temperature 98.6 F (37 C), temperature source Oral, resp. rate 20, SpO2 100 %. There is no height or weight on file to calculate BMI.  Demographic Factors:  Adolescent or young adult and Caucasian  Loss Factors: Financial problems/change in socioeconomic status  Historical Factors: Family history of mental illness or substance abuse  Risk Reduction Factors:   Living with another person, especially a relative, Positive social support, and Positive therapeutic relationship  Continued Clinical Symptoms:  Alcohol/Substance Abuse/Dependencies  Cognitive Features That Contribute To Risk:  Closed-mindedness    Suicide Risk:  Minimal: No identifiable suicidal ideation.  Patients presenting with no risk factors but with morbid ruminations; may be classified as minimal risk based on the severity of the depressive  symptoms  Plan Of Care/Follow-up recommendations:  Activity:  as tolerated  Diet:  heart healthy   Disposition: Take all medications as prescribed. Keep all follow-up appointments as scheduled.  Do not consume alcohol or use illegal drugs while on prescription medications. Report any adverse effects from your medications to your primary care provider promptly.  In the event of recurrent symptoms or worsening symptoms, call 911, a crisis hotline, or go to the nearest emergency department for evaluation.    Oneta Rack, NP 02/12/2022, 2:06 PM

## 2022-02-12 NOTE — BH Assessment (Addendum)
LCSW Progress Note   1008 - LCSW contacted [facility] to inquire about female bed availability for their rehab program.  LCSW was instructed to have the pt contact the intake department at [phone] to do a brief phone interview with Benny Lennert.  LCSW relayed this information to the pt with her provider, Hillery Jacks, NP, as witness.  Pt agreed to call for the interview.  LCSW spoke with pt regarding her plan of action for treatment and listened to her concerns.  LCSW place a referral for CD-IOP with Maeola Sarah, [facility].  LCSW informed the pt that she would be contacted either today or early next week by the facilitator to schedule an intake and assessment.    LCSW provided additional substance use treatment providers and detox/rehab facilities.  Hansel Starling, MSW, LCSW [facility] [phone] phone

## 2022-02-12 NOTE — ED Notes (Signed)
Pt sleeping at present, no distress noted.  Monitoring for safety. 

## 2022-03-10 IMAGING — CT CT ABD-PELV W/ CM
2 of 4 series · 15 of 46 positions shown, 17 images · IV contrast (APPLIED)
Comparison: None.

CLINICAL DATA: Abdominal pain suprapubic

EXAM:
CT ABDOMEN AND PELVIS WITH CONTRAST
TECHNIQUE: Multidetector CT imaging of the abdomen and pelvis was performed
using the standard protocol following bolus administration of
intravenous contrast.
CONTRAST:  100mL OMNIPAQUE IOHEXOL 300 MG/ML  SOLN

[Series 3: abdomen 5.0 · axial · 0.70mm/px · z∈[+730,+1115]mm · 12 of 89 slices shown, 14 images]
[im 6/89  soft-tissue]
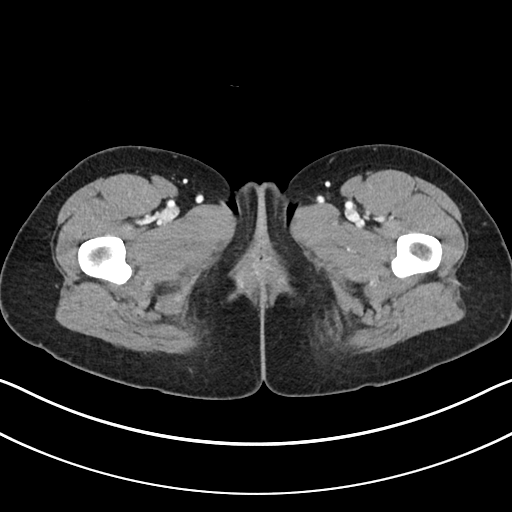
[im 6/89  bone]
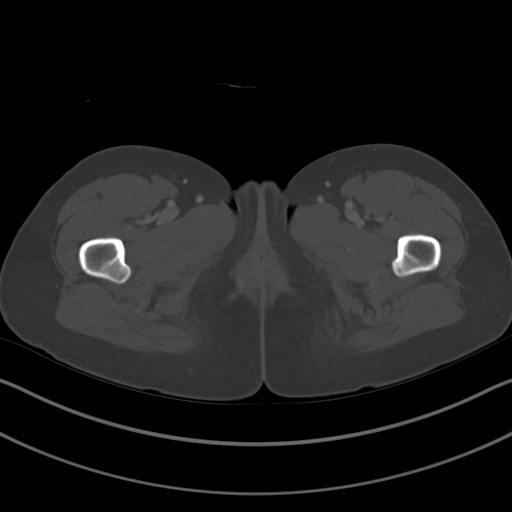
[im 16/89  soft-tissue]
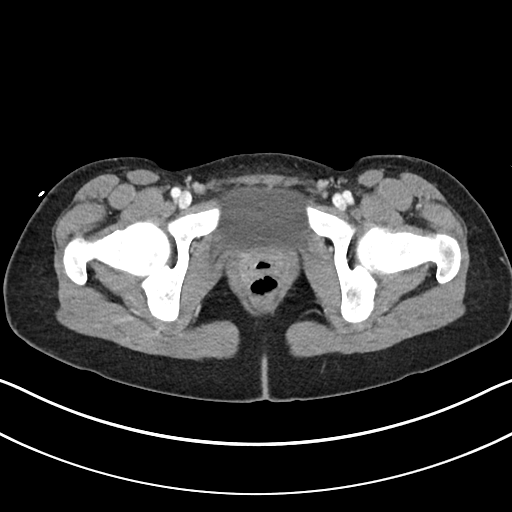
[im 21/89  soft-tissue]
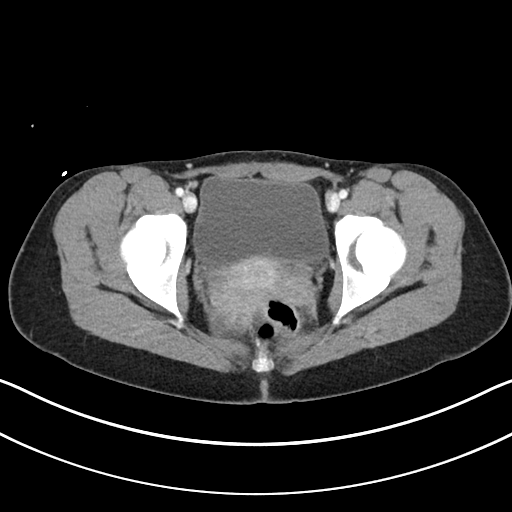
[im 26/89  soft-tissue]
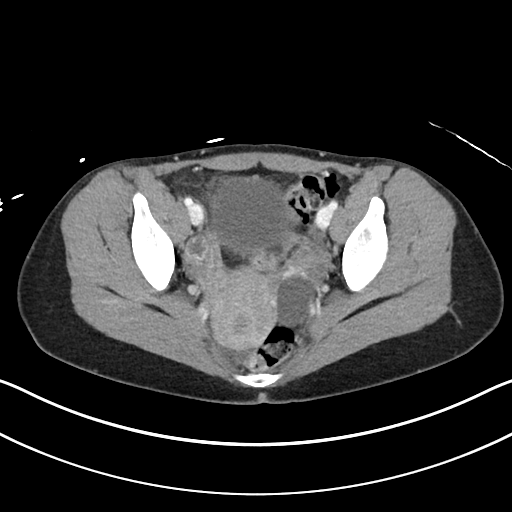
[im 37/89  soft-tissue]
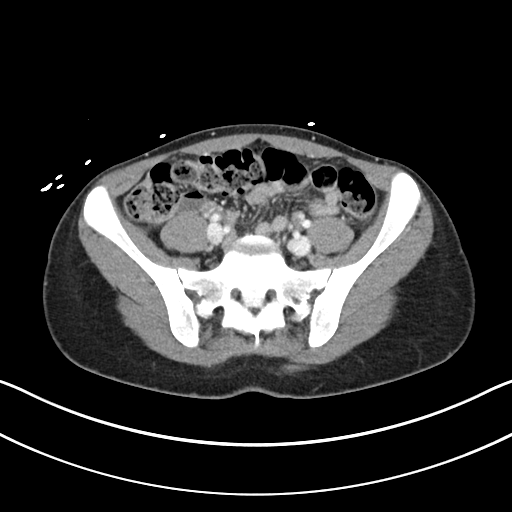
[im 42/89  soft-tissue]
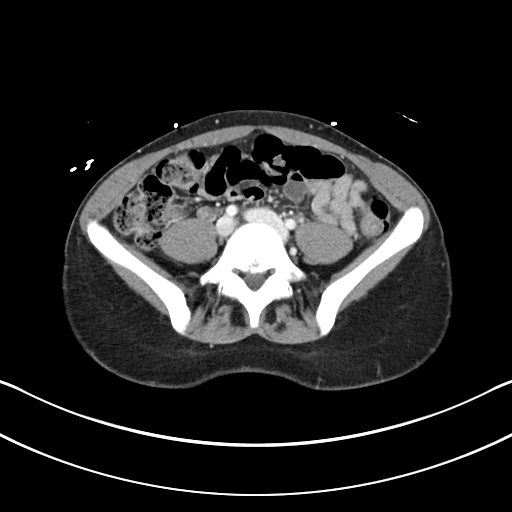
[im 47/89  soft-tissue]
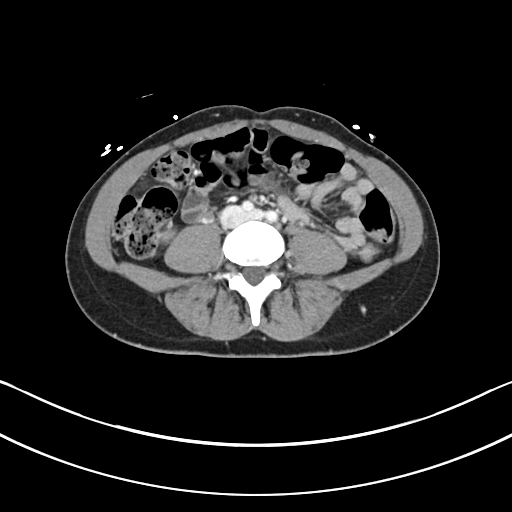
[im 57/89  soft-tissue]
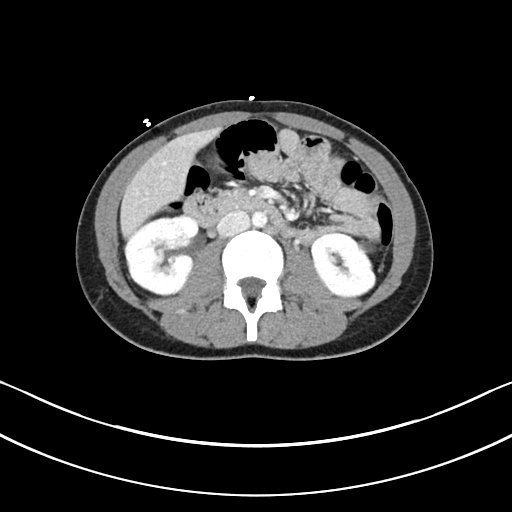
[im 63/89  soft-tissue]
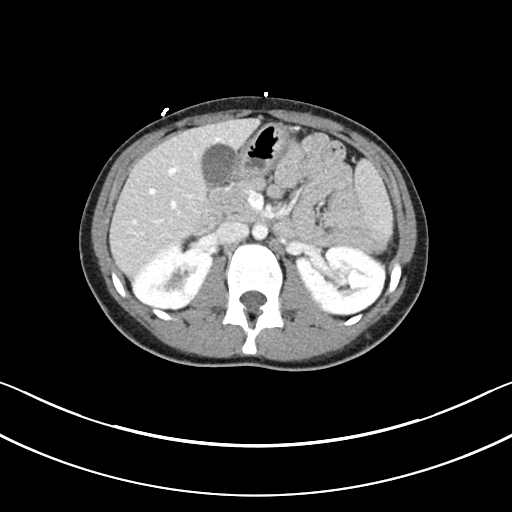
[im 63/89  bone]
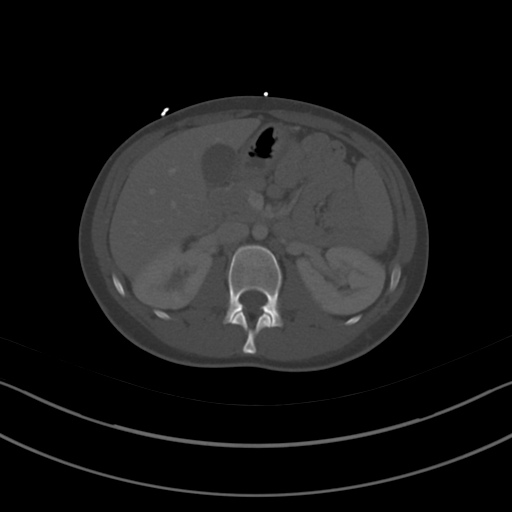
[im 68/89  soft-tissue]
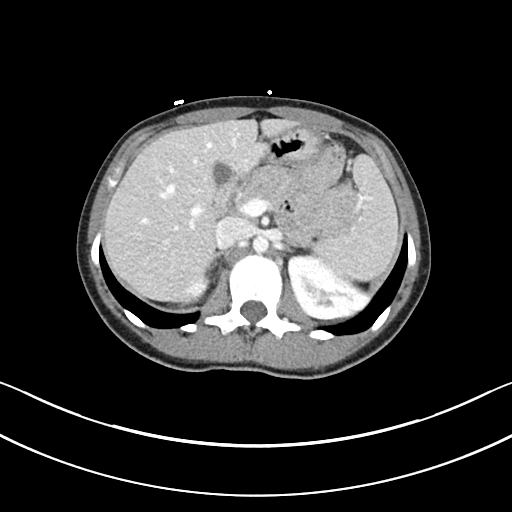
[im 78/89  soft-tissue]
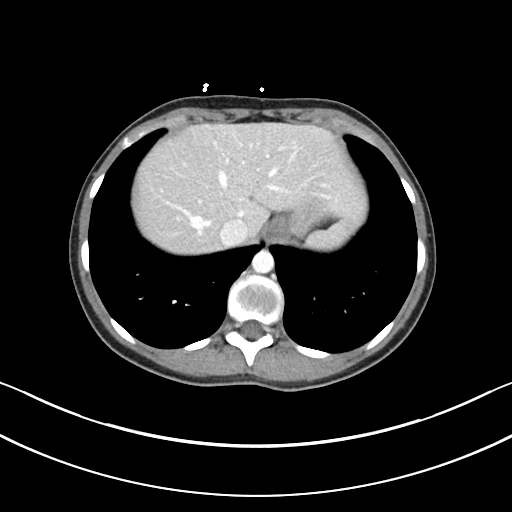
[im 83/89  soft-tissue]
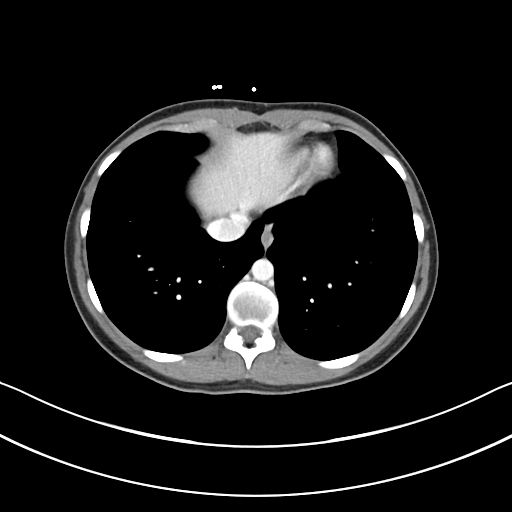

[Series 6: abdomen 3.0 mpr cor · coronal · 0.57mm/px · 3 of 73 slices shown]
[im 25/73  soft-tissue]
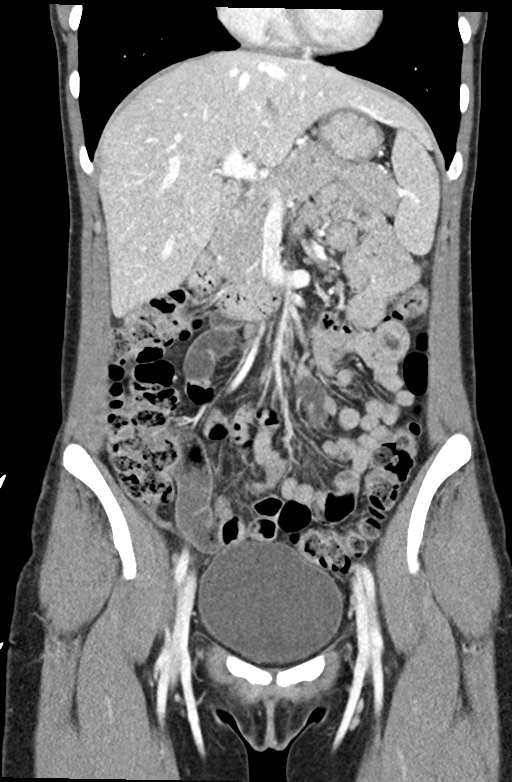
[im 33/73  soft-tissue]
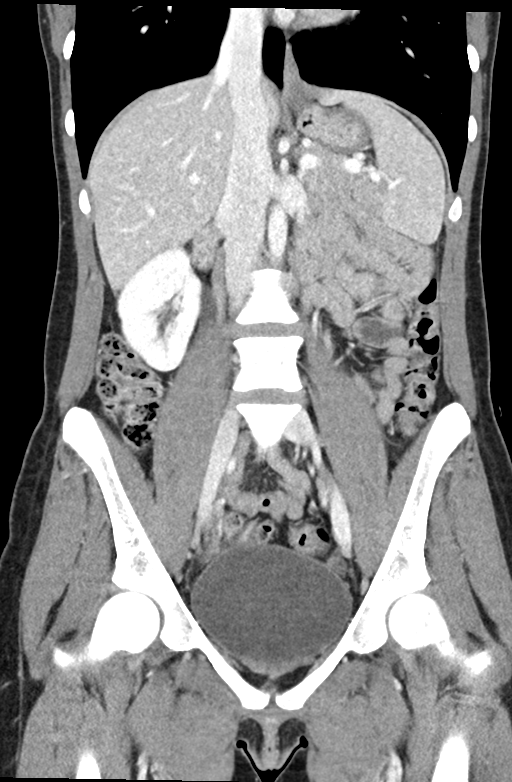
[im 41/73  soft-tissue]
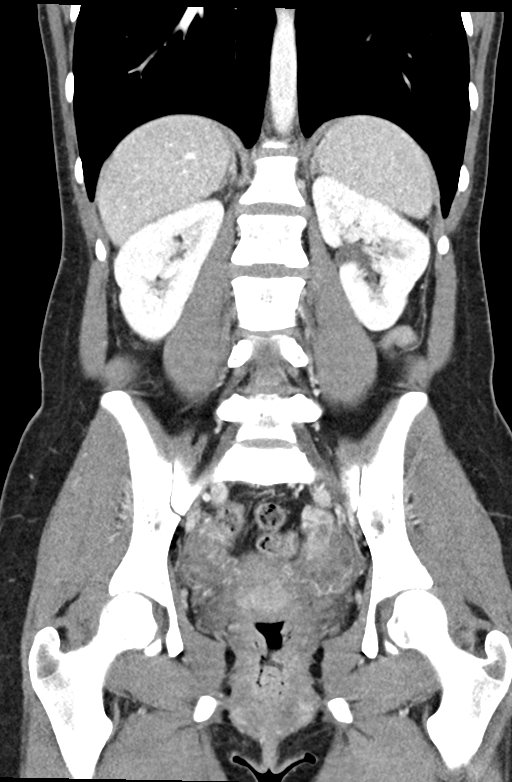

[15 of 46 positions shown; findings below may reference images not displayed]

FINDINGS: Lower chest: The visualized heart size within normal limits. No
pericardial fluid/thickening.

No hiatal hernia.

The visualized portions of the lungs are clear.

Hepatobiliary: There is a tiny 6 mm low-density lesion seen within
the right liver lobe.The main portal vein is patent. No evidence of
calcified gallstones, gallbladder wall thickening or biliary
dilatation.

Pancreas: Unremarkable. No pancreatic ductal dilatation or
surrounding inflammatory changes.

Spleen: Normal in size without focal abnormality.

Adrenals/Urinary Tract: Both adrenal glands appear normal. Punctate
calcifications seen in the pole of the right kidney. No
hydronephrosis. No collecting system stones are noted. Bladder is
unremarkable.

Stomach/Bowel: The stomach, small bowel, and colon are normal in
appearance. No inflammatory changes, wall thickening, or obstructive
findings.The appendix is normal.

Vascular/Lymphatic: There are no enlarged mesenteric,
retroperitoneal, or pelvic lymph nodes. No significant vascular
findings are present.

Reproductive: A probable corpus luteum cyst seen within the right
adnexa. Within the left adnexa there is a 4 cm low-density lesion
likely left ovarian cyst. A small amount free fluid seen within the
cul-de-sac.

Other: No evidence of abdominal wall mass or hernia.

Musculoskeletal: No acute or significant osseous findings.
IMPRESSION: Probable right corpus luteum cyst and 4 cm ovarian cyst.

Punctate nonobstructing right renal calculi
